# Patient Record
Sex: Female | Born: 1970 | Race: Black or African American | Hispanic: No | Marital: Single | Smoking: Never smoker
Health system: Southern US, Community
[De-identification: ages and names within clinical notes are randomized; demographics above are authoritative.]

## PROBLEM LIST (undated history)

## (undated) DIAGNOSIS — I10 Essential (primary) hypertension: Secondary | ICD-10-CM

## (undated) HISTORY — DX: Essential (primary) hypertension: I10

---

## 2007-05-27 ENCOUNTER — Other Ambulatory Visit: Admission: RE | Admit: 2007-05-27 | Discharge: 2007-05-27 | Payer: Self-pay | Admitting: Obstetrics and Gynecology

## 2009-07-21 ENCOUNTER — Other Ambulatory Visit: Admission: RE | Admit: 2009-07-21 | Discharge: 2009-07-21 | Payer: Self-pay | Admitting: Obstetrics and Gynecology

## 2010-09-19 ENCOUNTER — Other Ambulatory Visit: Admission: RE | Admit: 2010-09-19 | Discharge: 2010-09-19 | Payer: Self-pay | Admitting: Obstetrics and Gynecology

## 2014-01-23 ENCOUNTER — Other Ambulatory Visit: Payer: Self-pay

## 2014-01-23 DIAGNOSIS — Z1231 Encounter for screening mammogram for malignant neoplasm of breast: Secondary | ICD-10-CM

## 2014-02-10 ENCOUNTER — Other Ambulatory Visit: Payer: Self-pay | Admitting: Obstetrics and Gynecology

## 2014-02-10 ENCOUNTER — Other Ambulatory Visit (HOSPITAL_COMMUNITY)
Admission: RE | Admit: 2014-02-10 | Discharge: 2014-02-10 | Disposition: A | Discharge status: Home / Self Care | Payer: 59 | Source: Ambulatory Visit | Attending: Obstetrics and Gynecology | Admitting: Obstetrics and Gynecology

## 2014-02-10 DIAGNOSIS — Z01419 Encounter for gynecological examination (general) (routine) without abnormal findings: Secondary | ICD-10-CM | POA: Insufficient documentation

## 2014-02-10 DIAGNOSIS — Z1151 Encounter for screening for human papillomavirus (HPV): Secondary | ICD-10-CM | POA: Insufficient documentation

## 2014-02-11 ENCOUNTER — Ambulatory Visit: Payer: Self-pay

## 2014-02-13 ENCOUNTER — Ambulatory Visit
Admission: RE | Admit: 2014-02-13 | Discharge: 2014-02-13 | Disposition: A | Discharge status: Home / Self Care | Payer: 59 | Source: Ambulatory Visit

## 2014-02-13 DIAGNOSIS — Z1231 Encounter for screening mammogram for malignant neoplasm of breast: Secondary | ICD-10-CM

## 2015-01-28 ENCOUNTER — Other Ambulatory Visit: Payer: Self-pay | Admitting: Obstetrics and Gynecology

## 2015-01-28 ENCOUNTER — Other Ambulatory Visit (HOSPITAL_COMMUNITY)
Admission: RE | Admit: 2015-01-28 | Discharge: 2015-01-28 | Disposition: A | Discharge status: Home / Self Care | Payer: Self-pay | Source: Ambulatory Visit | Attending: Obstetrics and Gynecology | Admitting: Obstetrics and Gynecology

## 2015-01-28 DIAGNOSIS — Z01419 Encounter for gynecological examination (general) (routine) without abnormal findings: Secondary | ICD-10-CM | POA: Insufficient documentation

## 2015-02-02 LAB — CYTOLOGY - PAP

## 2015-05-05 ENCOUNTER — Other Ambulatory Visit: Payer: Self-pay

## 2015-05-05 DIAGNOSIS — Z1231 Encounter for screening mammogram for malignant neoplasm of breast: Secondary | ICD-10-CM

## 2015-05-07 ENCOUNTER — Ambulatory Visit
Admission: RE | Admit: 2015-05-07 | Discharge: 2015-05-07 | Disposition: A | Discharge status: Home / Self Care | Payer: 59 | Source: Ambulatory Visit

## 2015-05-07 DIAGNOSIS — Z1231 Encounter for screening mammogram for malignant neoplasm of breast: Secondary | ICD-10-CM

## 2016-11-01 ENCOUNTER — Other Ambulatory Visit: Payer: Self-pay | Admitting: Obstetrics and Gynecology

## 2016-11-01 DIAGNOSIS — Z1231 Encounter for screening mammogram for malignant neoplasm of breast: Secondary | ICD-10-CM

## 2016-11-27 ENCOUNTER — Ambulatory Visit: Payer: 59

## 2016-12-28 ENCOUNTER — Ambulatory Visit
Admission: RE | Admit: 2016-12-28 | Discharge: 2016-12-28 | Disposition: A | Discharge status: Home / Self Care | Payer: 59 | Source: Ambulatory Visit | Attending: Obstetrics and Gynecology | Admitting: Obstetrics and Gynecology

## 2016-12-28 DIAGNOSIS — Z1231 Encounter for screening mammogram for malignant neoplasm of breast: Secondary | ICD-10-CM | POA: Diagnosis not present

## 2017-07-10 ENCOUNTER — Other Ambulatory Visit (HOSPITAL_COMMUNITY)
Admission: RE | Admit: 2017-07-10 | Discharge: 2017-07-10 | Disposition: A | Discharge status: Home / Self Care | Payer: 59 | Source: Ambulatory Visit | Attending: Obstetrics and Gynecology | Admitting: Obstetrics and Gynecology

## 2017-07-10 ENCOUNTER — Other Ambulatory Visit: Payer: Self-pay | Admitting: Obstetrics and Gynecology

## 2017-07-10 DIAGNOSIS — Z01411 Encounter for gynecological examination (general) (routine) with abnormal findings: Secondary | ICD-10-CM | POA: Diagnosis not present

## 2017-07-10 DIAGNOSIS — Z124 Encounter for screening for malignant neoplasm of cervix: Secondary | ICD-10-CM | POA: Diagnosis not present

## 2017-07-12 LAB — CYTOLOGY - PAP
Diagnosis: NEGATIVE
HPV (WINDOPATH): NOT DETECTED

## 2018-07-31 DIAGNOSIS — R509 Fever, unspecified: Secondary | ICD-10-CM | POA: Diagnosis not present

## 2018-07-31 DIAGNOSIS — J209 Acute bronchitis, unspecified: Secondary | ICD-10-CM | POA: Diagnosis not present

## 2018-07-31 DIAGNOSIS — J029 Acute pharyngitis, unspecified: Secondary | ICD-10-CM | POA: Diagnosis not present

## 2018-08-19 DIAGNOSIS — D219 Benign neoplasm of connective and other soft tissue, unspecified: Secondary | ICD-10-CM | POA: Diagnosis not present

## 2018-08-19 DIAGNOSIS — Z01411 Encounter for gynecological examination (general) (routine) with abnormal findings: Secondary | ICD-10-CM | POA: Diagnosis not present

## 2018-08-28 DIAGNOSIS — R293 Abnormal posture: Secondary | ICD-10-CM | POA: Diagnosis not present

## 2018-08-28 DIAGNOSIS — M2560 Stiffness of unspecified joint, not elsewhere classified: Secondary | ICD-10-CM | POA: Diagnosis not present

## 2018-08-28 DIAGNOSIS — M545 Low back pain: Secondary | ICD-10-CM | POA: Diagnosis not present

## 2018-09-02 DIAGNOSIS — D219 Benign neoplasm of connective and other soft tissue, unspecified: Secondary | ICD-10-CM | POA: Diagnosis not present

## 2018-10-02 ENCOUNTER — Ambulatory Visit: Payer: 59

## 2018-10-02 ENCOUNTER — Other Ambulatory Visit: Payer: Self-pay | Admitting: Obstetrics and Gynecology

## 2018-10-02 DIAGNOSIS — Z1231 Encounter for screening mammogram for malignant neoplasm of breast: Secondary | ICD-10-CM

## 2018-10-04 ENCOUNTER — Ambulatory Visit
Admission: RE | Admit: 2018-10-04 | Discharge: 2018-10-04 | Disposition: A | Discharge status: Home / Self Care | Payer: 59 | Source: Ambulatory Visit | Attending: Obstetrics and Gynecology | Admitting: Obstetrics and Gynecology

## 2018-10-04 DIAGNOSIS — Z1231 Encounter for screening mammogram for malignant neoplasm of breast: Secondary | ICD-10-CM | POA: Diagnosis not present

## 2018-12-04 DIAGNOSIS — I10 Essential (primary) hypertension: Secondary | ICD-10-CM | POA: Diagnosis not present

## 2019-01-08 DIAGNOSIS — I10 Essential (primary) hypertension: Secondary | ICD-10-CM | POA: Diagnosis not present

## 2019-07-14 ENCOUNTER — Other Ambulatory Visit: Payer: Self-pay | Admitting: Physician Assistant

## 2019-11-09 IMAGING — MG DIGITAL SCREENING BILATERAL MAMMOGRAM WITH CAD
4 series · 4 of 4 positions shown · non-contrast
Comparison: Previous exam(s).

CLINICAL DATA: Screening.

EXAM:
DIGITAL SCREENING BILATERAL MAMMOGRAM WITH CAD

[L MLO]
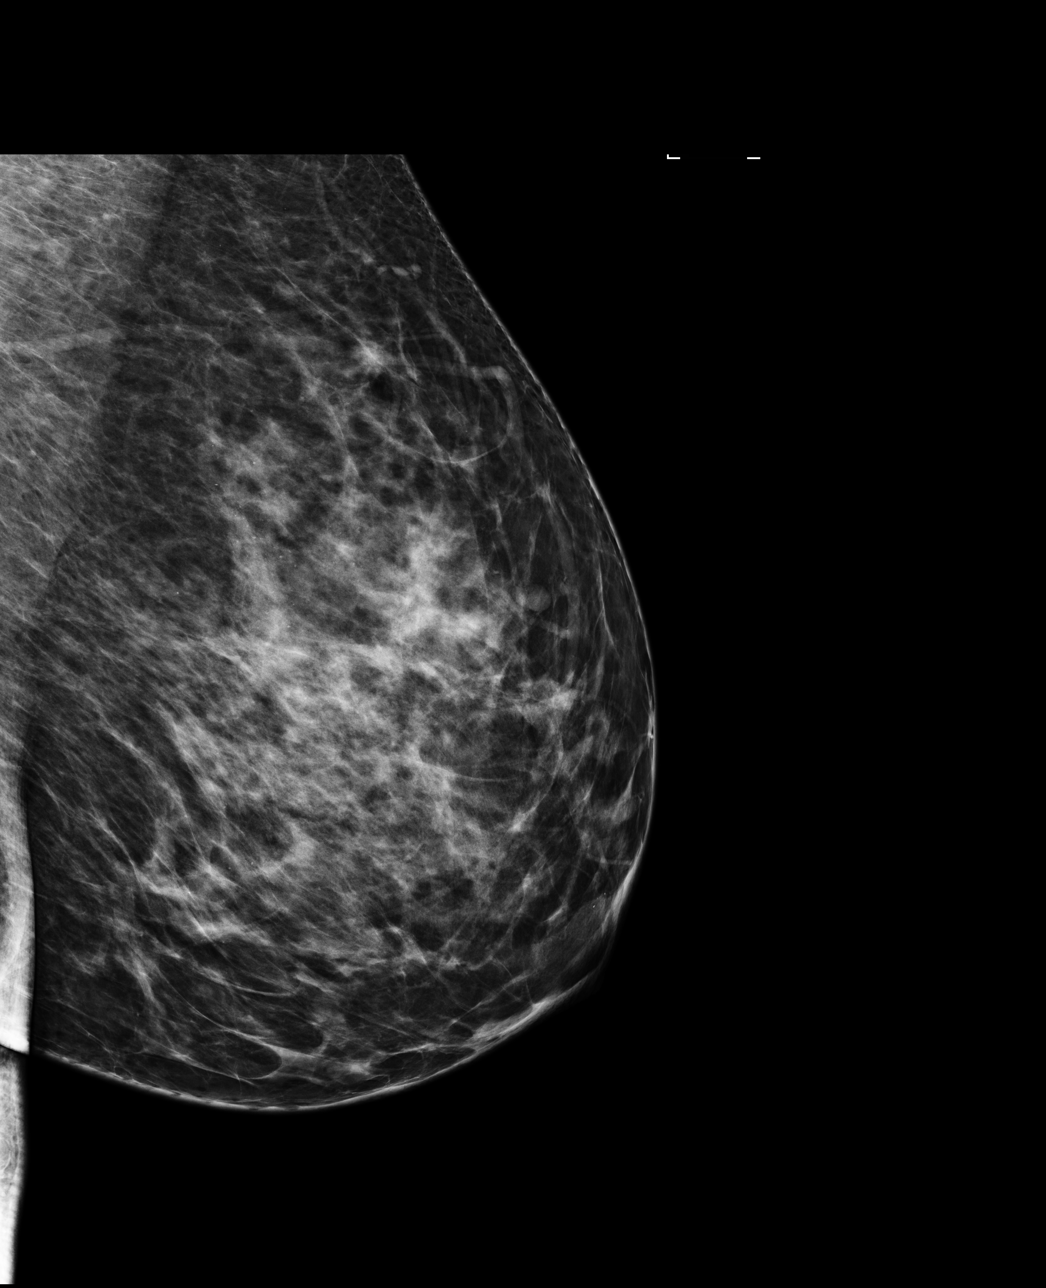

[R MLO]
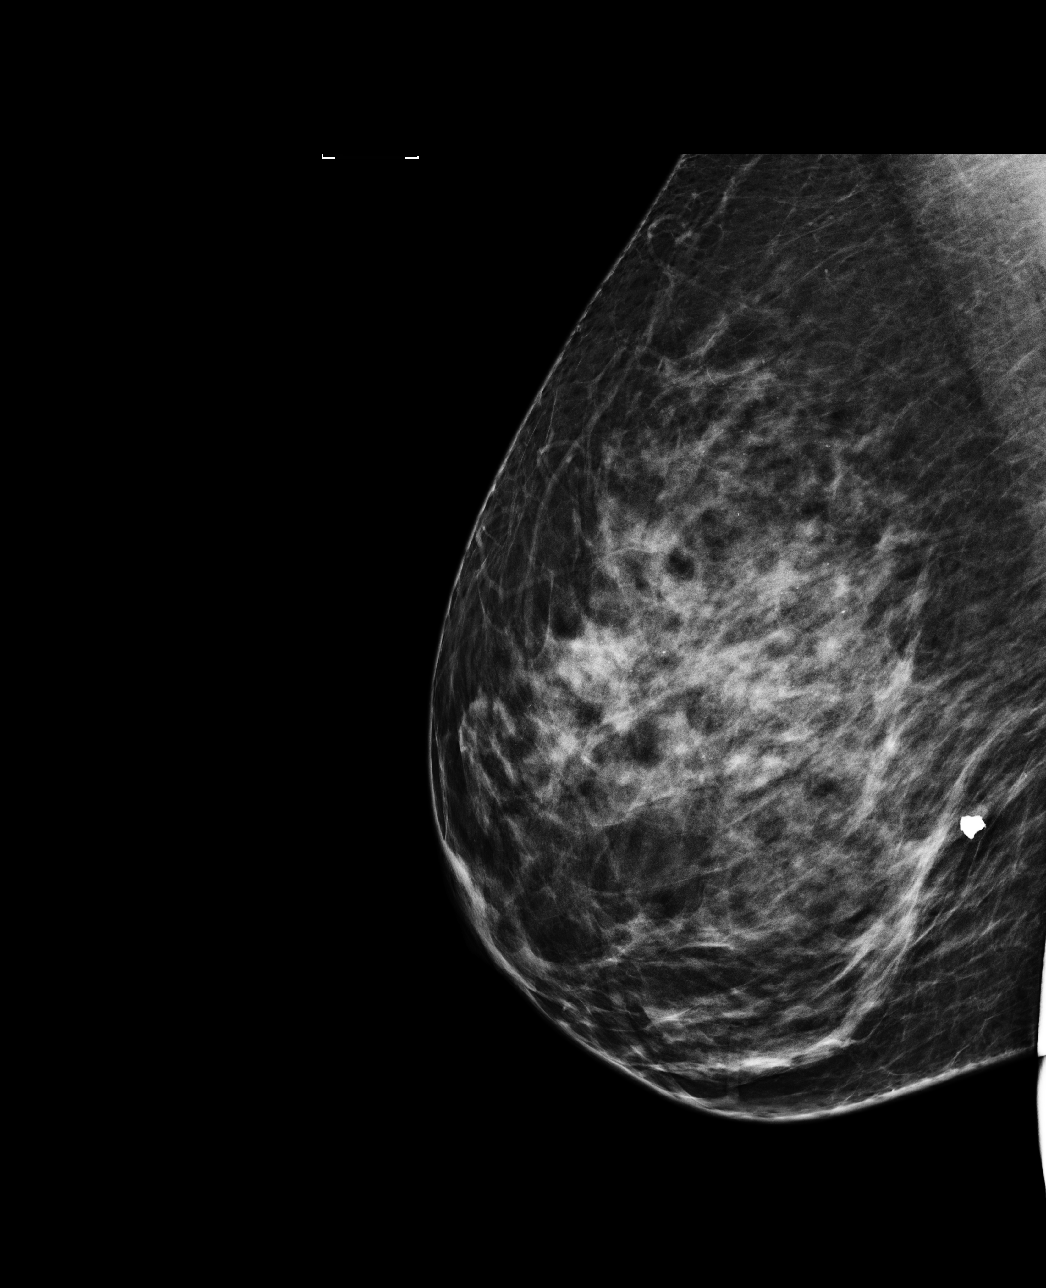

[R CC]
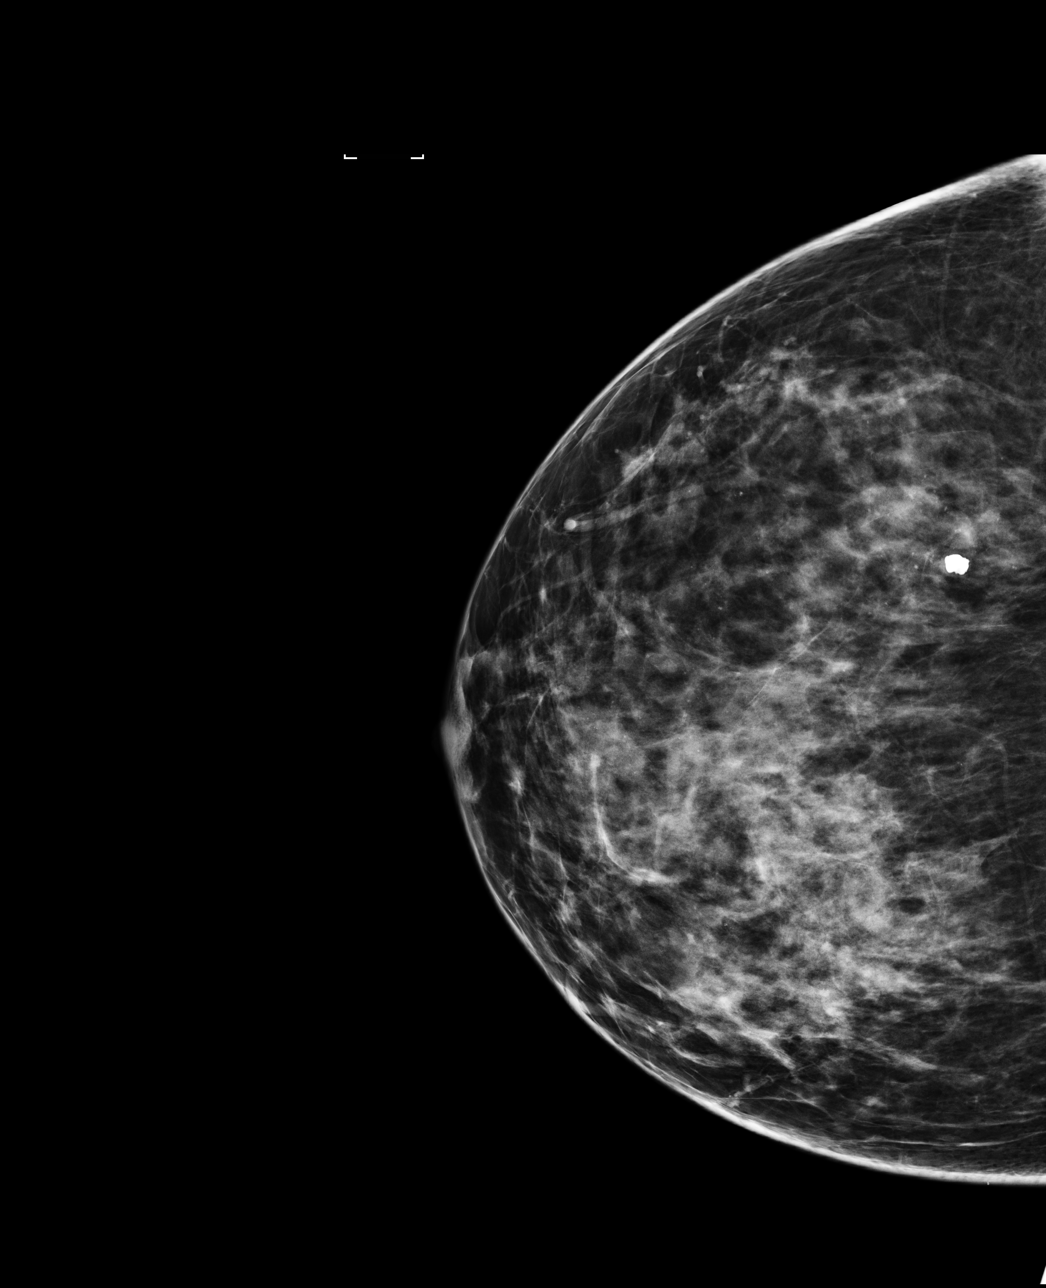

[L CC]
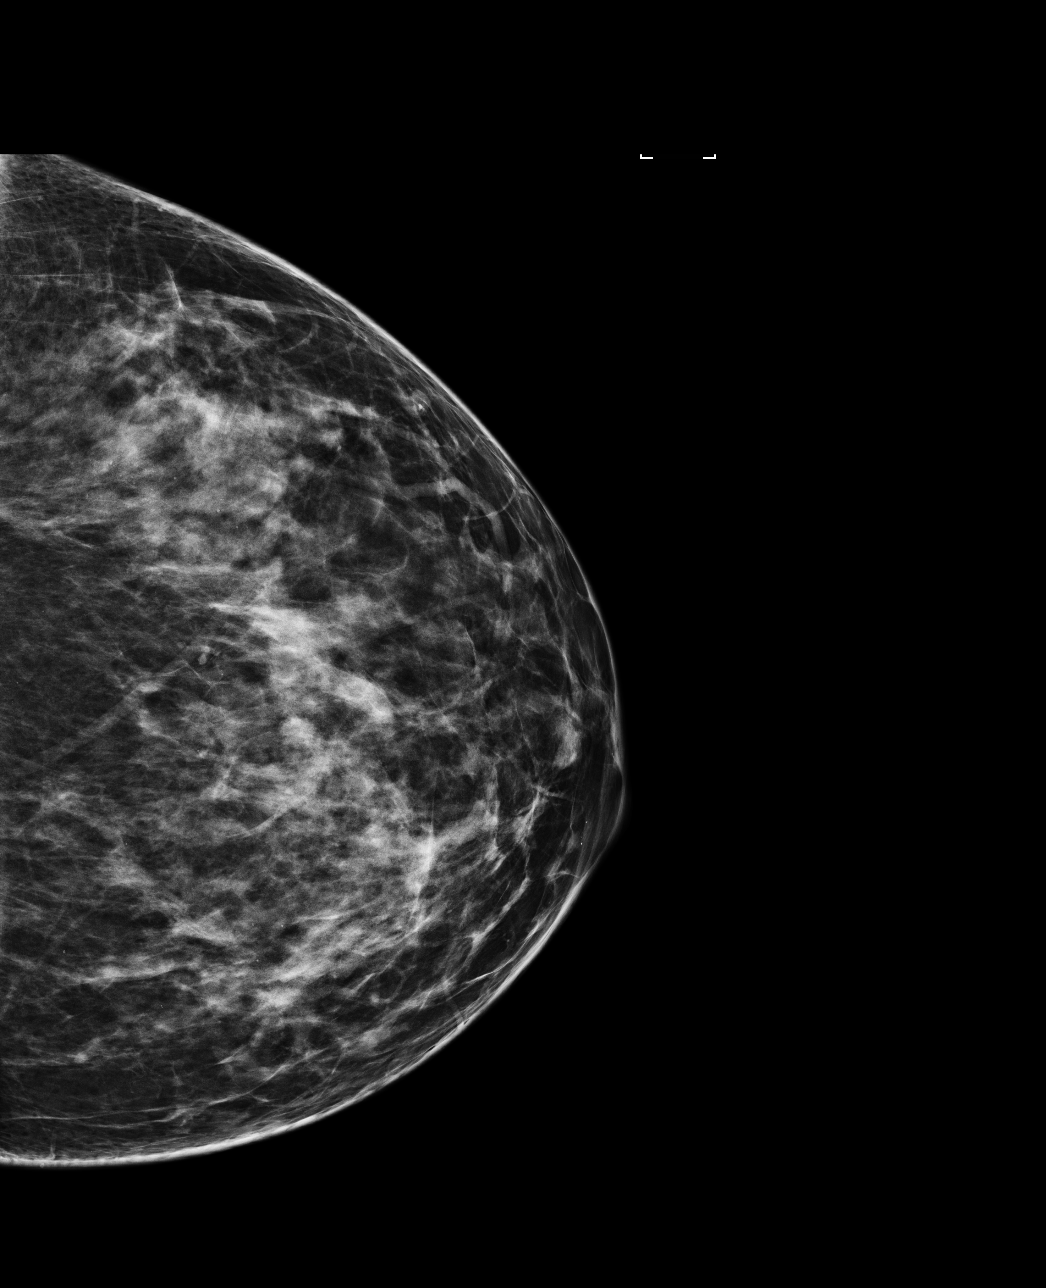

[4 of 4 positions shown; findings below may reference images not displayed]

ACR Breast Density Category c: The breast tissue is heterogeneously
dense, which may obscure small masses.
FINDINGS: There are no findings suspicious for malignancy. Images were
processed with CAD.
IMPRESSION: No mammographic evidence of malignancy. A result letter of this
screening mammogram will be mailed directly to the patient.

RECOMMENDATION:
Screening mammogram in one year. (Code:YJ-2-FEZ)

BI-RADS CATEGORY  1: Negative.

## 2019-11-26 ENCOUNTER — Other Ambulatory Visit: Payer: Self-pay | Admitting: Obstetrics and Gynecology

## 2019-11-26 DIAGNOSIS — Z1231 Encounter for screening mammogram for malignant neoplasm of breast: Secondary | ICD-10-CM

## 2020-01-15 ENCOUNTER — Ambulatory Visit
Admission: RE | Admit: 2020-01-15 | Discharge: 2020-01-15 | Disposition: A | Discharge status: Home / Self Care | Payer: 59 | Source: Ambulatory Visit | Attending: Obstetrics and Gynecology | Admitting: Obstetrics and Gynecology

## 2020-01-15 ENCOUNTER — Other Ambulatory Visit: Payer: Self-pay

## 2020-01-15 DIAGNOSIS — Z1231 Encounter for screening mammogram for malignant neoplasm of breast: Secondary | ICD-10-CM

## 2020-03-12 ENCOUNTER — Ambulatory Visit: Payer: 59 | Attending: Internal Medicine

## 2020-03-12 DIAGNOSIS — Z23 Encounter for immunization: Secondary | ICD-10-CM

## 2020-03-12 NOTE — Progress Notes (Signed)
   Covid-19 Vaccination Clinic  Name:  Kerri Cortez    MRN: [id number] DOB: 02-05-1971  03/12/2020  [name] was observed post Covid-19 immunization for 15 minutes without incident. She was provided with Vaccine Information Sheet and instruction to access the V-Safe system.   [name] was instructed to call 911 with any severe reactions post vaccine: Marland Kitchen Difficulty breathing  . Swelling of face and throat  . A fast heartbeat  . A bad rash all over body  . Dizziness and weakness   Immunizations Administered    Name Date Dose VIS Date Route   Pfizer COVID-19 Vaccine 03/12/2020  8:23 AM 0.3 mL 12/05/2019 Intramuscular   Manufacturer: [place]   Lot: [id number]   [place]: [id number]

## 2020-04-06 ENCOUNTER — Ambulatory Visit: Payer: 59 | Attending: Internal Medicine

## 2020-04-06 DIAGNOSIS — Z23 Encounter for immunization: Secondary | ICD-10-CM

## 2020-04-06 NOTE — Progress Notes (Signed)
   Covid-19 Vaccination Clinic  Name:  Kerri Cortez    MRN: [id number] DOB: 06-25-1971  04/06/2020  [name] was observed post Covid-19 immunization for 15 minutes without incident. She was provided with Vaccine Information Sheet and instruction to access the V-Safe system.   [name] was instructed to call 911 with any severe reactions post vaccine: Marland Kitchen Difficulty breathing  . Swelling of face and throat  . A fast heartbeat  . A bad rash all over body  . Dizziness and weakness   Immunizations Administered    Name Date Dose VIS Date Route   Pfizer COVID-19 Vaccine 04/06/2020  9:43 AM 0.3 mL 12/05/2019 Intramuscular   Manufacturer: [place]   Lot: [id number]   [place]: [id number]

## 2020-08-03 ENCOUNTER — Ambulatory Visit
Admission: RE | Admit: 2020-08-03 | Discharge: 2020-08-03 | Disposition: A | Discharge status: Home / Self Care | Payer: 59 | Source: Ambulatory Visit | Attending: Physician Assistant | Admitting: Physician Assistant

## 2020-08-03 ENCOUNTER — Other Ambulatory Visit: Payer: Self-pay | Admitting: Physician Assistant

## 2020-08-03 DIAGNOSIS — R0789 Other chest pain: Secondary | ICD-10-CM

## 2020-08-03 DIAGNOSIS — R0602 Shortness of breath: Secondary | ICD-10-CM

## 2020-08-22 DIAGNOSIS — R0789 Other chest pain: Secondary | ICD-10-CM | POA: Insufficient documentation

## 2020-08-22 NOTE — Progress Notes (Signed)
Patient referred by Lennie Odor, PA for exertional dyspnea   Subjective:   Kerri Cortez, female    DOB: 1971-12-23, 49 y.o.   MRN: [id number]   Chief Complaint  Patient presents with  . Chest Pain     HPI  49 y.o. African American female with hypertension, microcytic anemia, exertional dyspnea, leg edema  Patient reports exertional dyspnea with more than usual activity. She reports ankle swelling. She reports chest pain, only when she works long hours, until her shift ends. She denies any exertional chest pain symptoms.   Blood pressure is elevated today. She is currently on valsartan 80 mg, metoprolol succinate 100 mg daily.   She is anemic, known to have uterine fibroids. She sees OBGYN for this.   Past Medical History:  Diagnosis Date  . Hypertension      History reviewed. No pertinent surgical history.   Social History   Tobacco Use  Smoking Status Never Smoker  Smokeless Tobacco Never Used    Social History   Substance and Sexual Activity  Alcohol Use Not Currently     Family History  Problem Relation Age of Onset  . Hypertension Mother   . Heart disease Father      Current Outpatient Medications on File Prior to Visit  Medication Sig Dispense Refill  . metoprolol succinate (TOPROL-XL) 100 MG 24 hr tablet Take 100 mg by mouth daily.    Marland Kitchen POLY-IRON 150 150 MG capsule Take 150 mg by mouth 2 (two) times daily.    . valsartan (DIOVAN) 80 MG tablet Take 80 mg by mouth daily.     No current facility-administered medications on file prior to visit.    Cardiovascular and other pertinent studies:  EKG 08/23/2020: Sinus rhythm 78 bpm First degree A-V block  Borderline left atrial enlargement Nonspecific T-abnormality  Recent labs: 08/03/2020: Glucose 96, BUN/Cr 16/0.93. EGFR 78. Na/K 137/3.8. Rest of the CMP normal H/H 9.4/30. MCV 63.9. Platelets 368 Chol 171, TG 325, HDL 38, LDL 80   Review of Systems  Cardiovascular: Positive for  dyspnea on exertion and leg swelling. Negative for chest pain, palpitations and syncope.  Respiratory: Positive for shortness of breath.          Vitals:   08/23/20 0905  BP: (!) 152/102  Pulse: 82  Resp: 16  SpO2: 98%     Body mass index is 32.96 kg/m. Filed Weights   08/23/20 0905  Weight: 204 lb 3.2 oz (92.6 kg)     Objective:   Physical Exam Vitals and nursing note reviewed.  Constitutional:      General: She is not in acute distress. Neck:     Vascular: No JVD.  Cardiovascular:     Rate and Rhythm: Normal rate and regular rhythm.     Heart sounds: Normal heart sounds. No murmur heard.   Pulmonary:     Effort: Pulmonary effort is normal.     Breath sounds: Normal breath sounds. No wheezing or rales.  Musculoskeletal:     Right lower leg: Edema (Trace) present.     Left lower leg: Edema (Trace) present.         Assessment & Recommendations:   49 y.o. African American female with hypertension, microcytic anemia, exertional dyspnea, leg edema  Exertional dyspnea: I suspect her symptoms are primarily due to anemia and hypertension. I recommend continued management of anemia. I have switched valsartan 80 mg to chlorthalidone 25 mg daily. I will obtain echocardiogram. Unless any significant abnormalities  I will see her on as needed basis.  Hypertension: As above  Elevated triglyceride: Recommend low carbohydrate diet. Consider repeating fasting lipid panel in 3 months.   Thank you for referring the patient to us. Please feel free to contact with any questions.   Manish J Patwardhan, MD Pager: [phone] Office: [phone] 

## 2020-08-23 ENCOUNTER — Ambulatory Visit: Payer: 59 | Admitting: Cardiology

## 2020-08-23 ENCOUNTER — Other Ambulatory Visit: Payer: Self-pay

## 2020-08-23 ENCOUNTER — Encounter: Payer: Self-pay | Admitting: Cardiology

## 2020-08-23 VITALS — BP 146/89 | HR 86 | Resp 16 | Ht 66.0 in | Wt 204.2 lb

## 2020-08-23 DIAGNOSIS — D509 Iron deficiency anemia, unspecified: Secondary | ICD-10-CM

## 2020-08-23 DIAGNOSIS — R0600 Dyspnea, unspecified: Secondary | ICD-10-CM

## 2020-08-23 DIAGNOSIS — R0609 Other forms of dyspnea: Secondary | ICD-10-CM

## 2020-08-23 DIAGNOSIS — I10 Essential (primary) hypertension: Secondary | ICD-10-CM | POA: Insufficient documentation

## 2020-08-23 MED ORDER — CHLORTHALIDONE 25 MG PO TABS: 25.0000 mg | ORAL_TABLET | Freq: Every day | ORAL | 1 refills | Status: AC

## 2020-09-01 ENCOUNTER — Other Ambulatory Visit: Payer: Self-pay

## 2020-09-01 ENCOUNTER — Ambulatory Visit: Payer: 59

## 2020-09-01 DIAGNOSIS — I10 Essential (primary) hypertension: Secondary | ICD-10-CM

## 2021-02-19 IMAGING — MG DIGITAL SCREENING BILAT W/ CAD
4 series · 4 of 4 positions shown · non-contrast
Comparison: Previous exam(s).

CLINICAL DATA: Screening.

EXAM:
DIGITAL SCREENING BILATERAL MAMMOGRAM WITH CAD

[R MLO]
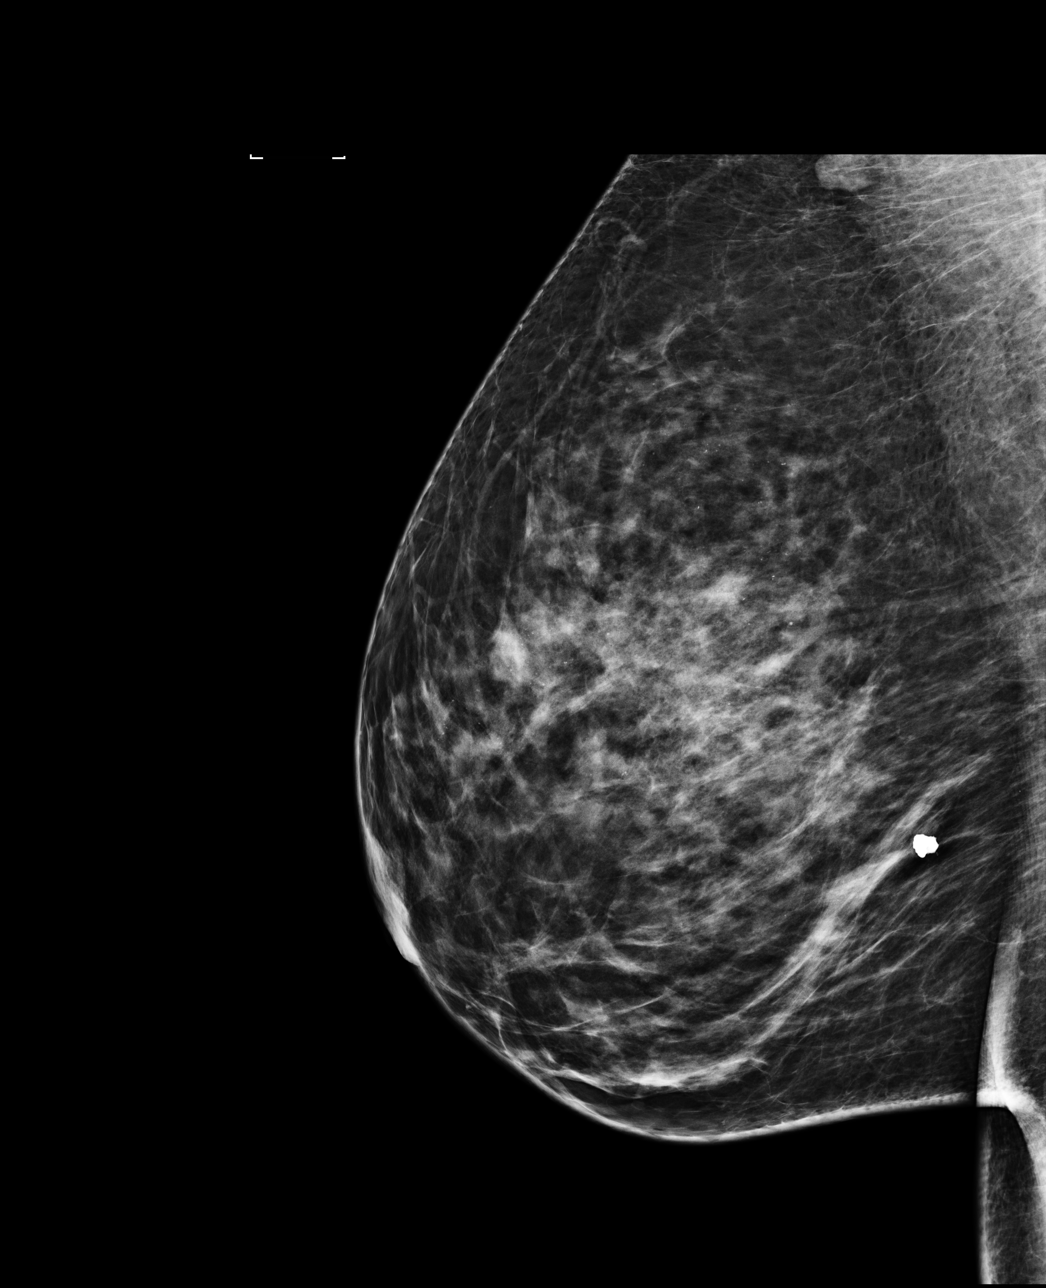

[L CC]
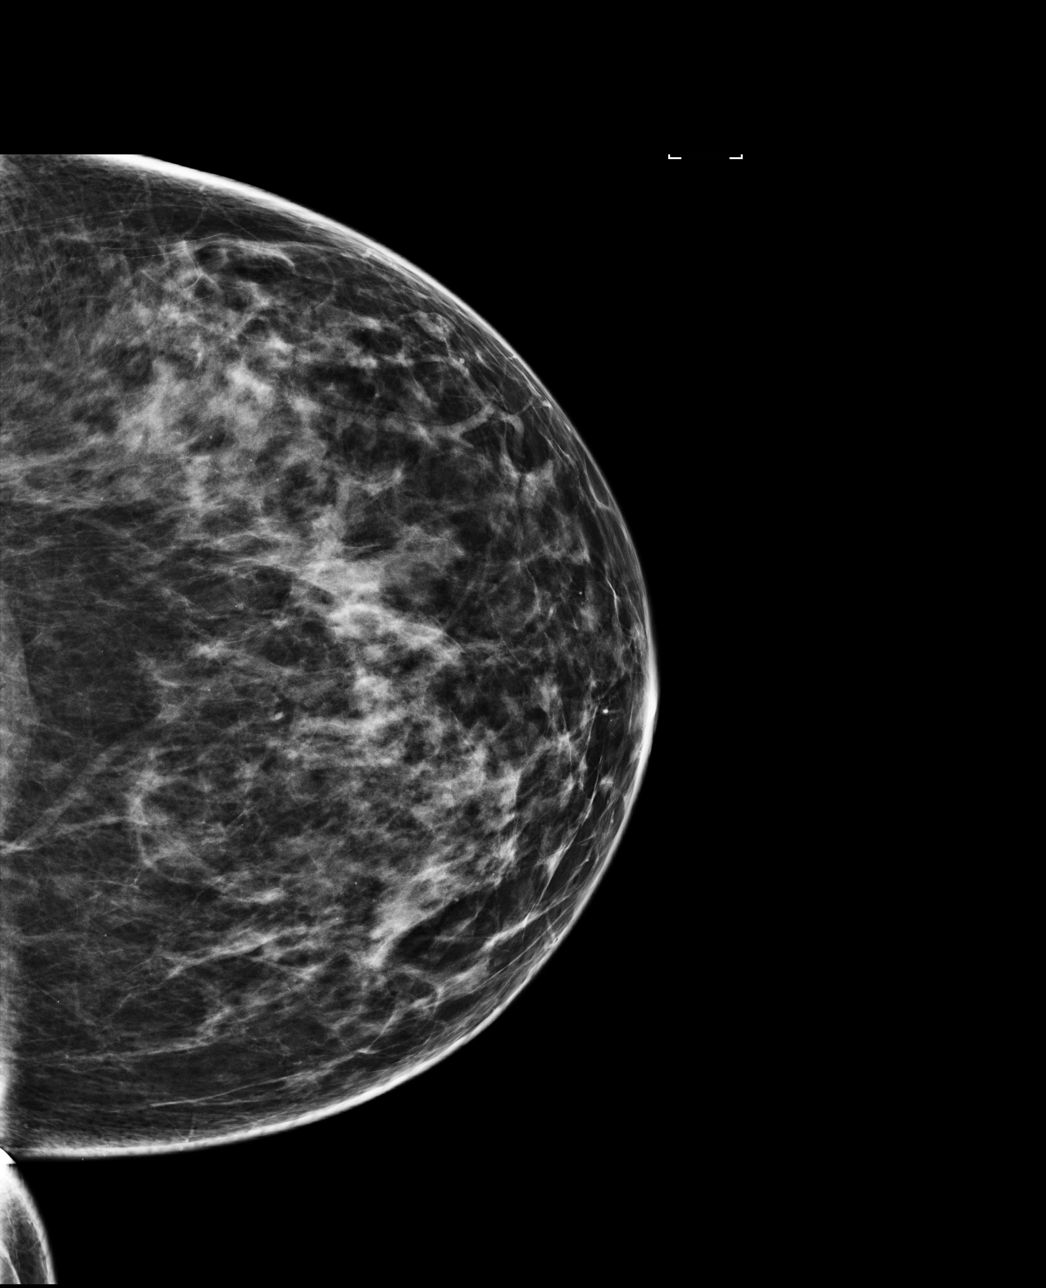

[R CC]
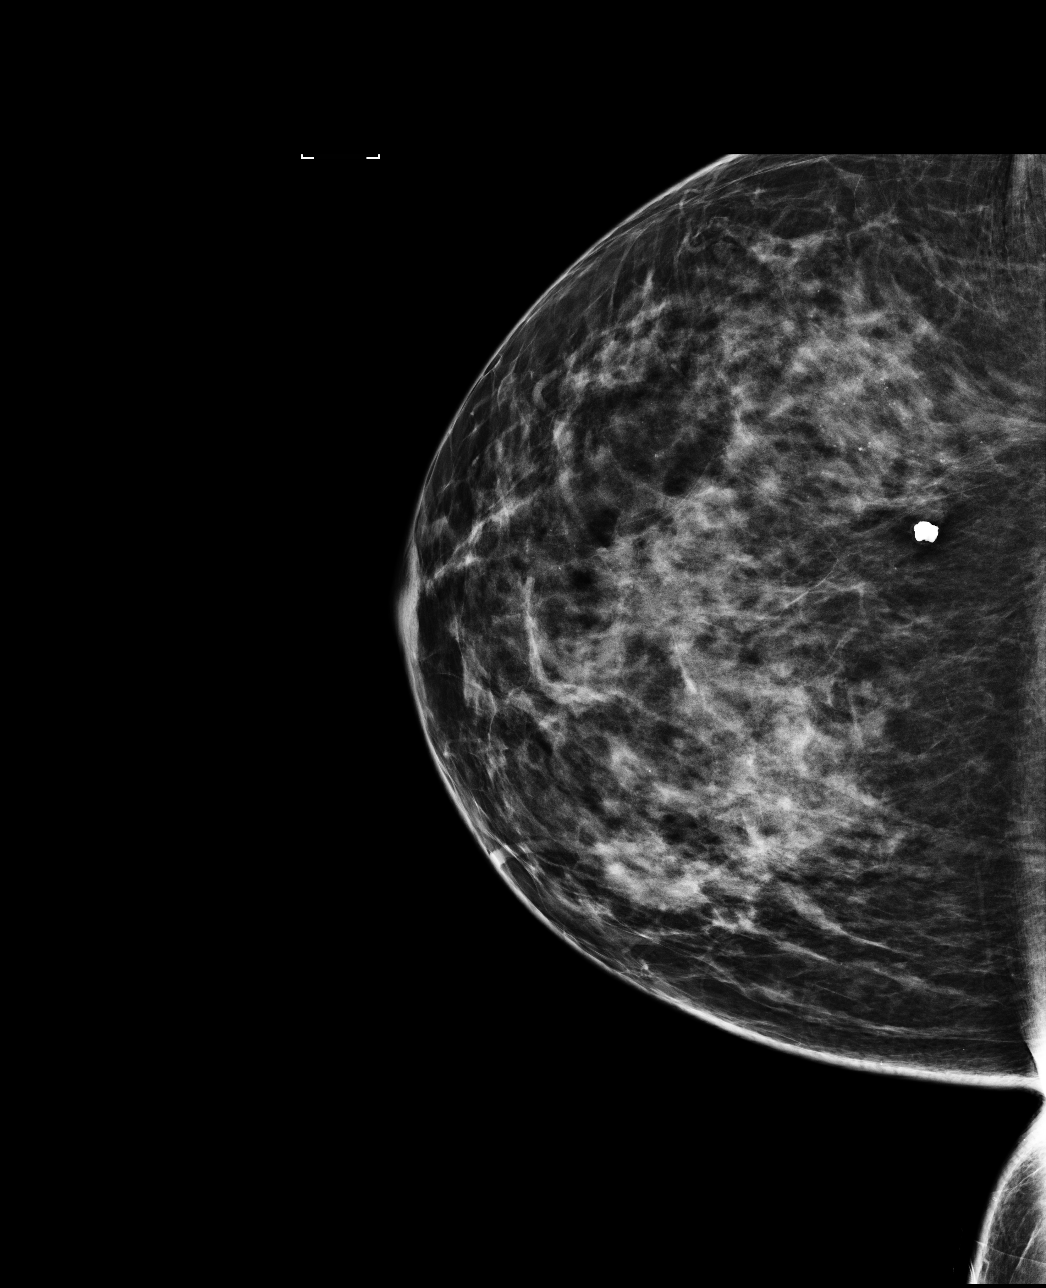

[L MLO]
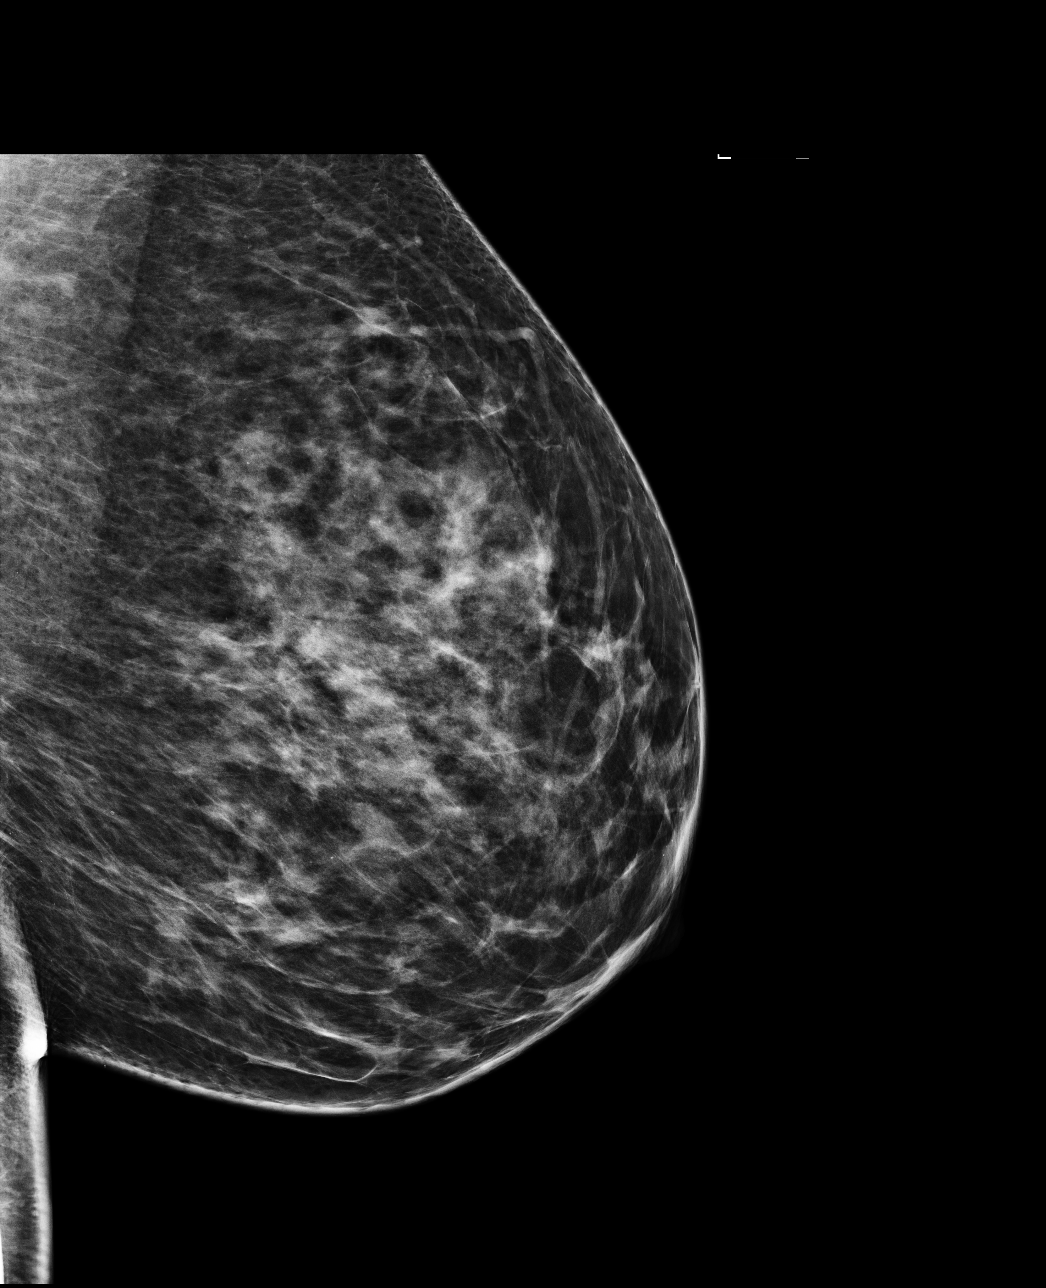

[4 of 4 positions shown; findings below may reference images not displayed]

ACR Breast Density Category c: The breast tissue is heterogeneously
dense, which may obscure small masses.
FINDINGS: There are no findings suspicious for malignancy. Images were
processed with CAD.
IMPRESSION: No mammographic evidence of malignancy. A result letter of this
screening mammogram will be mailed directly to the patient.

RECOMMENDATION:
Screening mammogram in one year. (Code:YJ-[id number])

BI-RADS CATEGORY  1: Negative.

## 2021-08-23 ENCOUNTER — Other Ambulatory Visit: Payer: Self-pay | Admitting: Obstetrics and Gynecology

## 2021-08-23 DIAGNOSIS — Z1231 Encounter for screening mammogram for malignant neoplasm of breast: Secondary | ICD-10-CM

## 2021-08-24 ENCOUNTER — Other Ambulatory Visit: Payer: Self-pay

## 2021-08-24 ENCOUNTER — Ambulatory Visit
Admission: RE | Admit: 2021-08-24 | Discharge: 2021-08-24 | Disposition: A | Discharge status: Home / Self Care | Payer: 59 | Source: Ambulatory Visit | Attending: Obstetrics and Gynecology | Admitting: Obstetrics and Gynecology

## 2021-08-24 DIAGNOSIS — Z1231 Encounter for screening mammogram for malignant neoplasm of breast: Secondary | ICD-10-CM

## 2022-05-03 ENCOUNTER — Other Ambulatory Visit: Payer: Self-pay | Admitting: Obstetrics and Gynecology

## 2023-01-13 DIAGNOSIS — R69 Illness, unspecified: Secondary | ICD-10-CM | POA: Diagnosis not present

## 2023-01-24 DIAGNOSIS — R69 Illness, unspecified: Secondary | ICD-10-CM | POA: Diagnosis not present

## 2023-02-03 DIAGNOSIS — R69 Illness, unspecified: Secondary | ICD-10-CM | POA: Diagnosis not present

## 2023-02-08 DIAGNOSIS — F411 Generalized anxiety disorder: Secondary | ICD-10-CM | POA: Diagnosis not present

## 2023-02-17 DIAGNOSIS — F411 Generalized anxiety disorder: Secondary | ICD-10-CM | POA: Diagnosis not present

## 2023-02-21 DIAGNOSIS — F411 Generalized anxiety disorder: Secondary | ICD-10-CM | POA: Diagnosis not present

## 2023-03-08 DIAGNOSIS — F411 Generalized anxiety disorder: Secondary | ICD-10-CM | POA: Diagnosis not present

## 2023-03-17 DIAGNOSIS — F411 Generalized anxiety disorder: Secondary | ICD-10-CM | POA: Diagnosis not present

## 2023-03-21 DIAGNOSIS — F411 Generalized anxiety disorder: Secondary | ICD-10-CM | POA: Diagnosis not present

## 2023-03-28 DIAGNOSIS — F411 Generalized anxiety disorder: Secondary | ICD-10-CM | POA: Diagnosis not present

## 2023-04-02 ENCOUNTER — Emergency Department (HOSPITAL_COMMUNITY): Payer: No Typology Code available for payment source

## 2023-04-02 ENCOUNTER — Other Ambulatory Visit: Payer: Self-pay

## 2023-04-02 ENCOUNTER — Emergency Department (HOSPITAL_COMMUNITY)
Admission: EM | Admit: 2023-04-02 | Discharge: 2023-04-02 | Disposition: A | Discharge status: Home / Self Care | Payer: No Typology Code available for payment source | Attending: Emergency Medicine | Admitting: Emergency Medicine

## 2023-04-02 DIAGNOSIS — M546 Pain in thoracic spine: Secondary | ICD-10-CM | POA: Diagnosis not present

## 2023-04-02 DIAGNOSIS — Y92410 Unspecified street and highway as the place of occurrence of the external cause: Secondary | ICD-10-CM | POA: Diagnosis not present

## 2023-04-02 DIAGNOSIS — M79644 Pain in right finger(s): Secondary | ICD-10-CM | POA: Insufficient documentation

## 2023-04-02 DIAGNOSIS — M25511 Pain in right shoulder: Secondary | ICD-10-CM | POA: Insufficient documentation

## 2023-04-02 DIAGNOSIS — M542 Cervicalgia: Secondary | ICD-10-CM | POA: Insufficient documentation

## 2023-04-02 MED ORDER — NAPROXEN 500 MG PO TABS: 500.0000 mg | ORAL_TABLET | Freq: Two times a day (BID) | ORAL | 0 refills | Status: AC

## 2023-04-02 MED ORDER — ACETAMINOPHEN 500 MG PO TABS: 500.0000 mg | ORAL_TABLET | Freq: Four times a day (QID) | ORAL | 0 refills | Status: AC | PRN

## 2023-04-02 MED ORDER — METHOCARBAMOL 500 MG PO TABS
750.0000 mg | ORAL_TABLET | Freq: Once | ORAL | Status: DC
  Filled 2023-04-02: qty 2

## 2023-04-02 MED ORDER — LIDOCAINE 5 % EX PTCH
2.0000 | MEDICATED_PATCH | CUTANEOUS | Status: DC
  Administered 2023-04-02: 2 via TRANSDERMAL
  Filled 2023-04-02: qty 2

## 2023-04-02 MED ORDER — LIDOCAINE 5 % EX PTCH: 1.0000 | MEDICATED_PATCH | CUTANEOUS | 0 refills | Status: AC

## 2023-04-02 MED ORDER — METHOCARBAMOL 500 MG PO TABS: 500.0000 mg | ORAL_TABLET | Freq: Two times a day (BID) | ORAL | 0 refills | Status: AC

## 2023-04-02 MED ORDER — NAPROXEN 500 MG PO TABS
500.0000 mg | ORAL_TABLET | Freq: Once | ORAL | Status: AC
  Administered 2023-04-02: 500 mg via ORAL
  Filled 2023-04-02: qty 1
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
## 2023-04-07 DIAGNOSIS — F411 Generalized anxiety disorder: Secondary | ICD-10-CM | POA: Diagnosis not present

## 2023-04-11 DIAGNOSIS — F411 Generalized anxiety disorder: Secondary | ICD-10-CM | POA: Diagnosis not present

## 2023-04-16 DIAGNOSIS — D259 Leiomyoma of uterus, unspecified: Secondary | ICD-10-CM | POA: Diagnosis not present

## 2023-04-16 DIAGNOSIS — Z01419 Encounter for gynecological examination (general) (routine) without abnormal findings: Secondary | ICD-10-CM | POA: Diagnosis not present

## 2023-04-18 DIAGNOSIS — F411 Generalized anxiety disorder: Secondary | ICD-10-CM | POA: Diagnosis not present

## 2023-05-02 DIAGNOSIS — F411 Generalized anxiety disorder: Secondary | ICD-10-CM | POA: Diagnosis not present

## 2023-05-08 DIAGNOSIS — D259 Leiomyoma of uterus, unspecified: Secondary | ICD-10-CM | POA: Diagnosis not present

## 2023-05-09 DIAGNOSIS — F411 Generalized anxiety disorder: Secondary | ICD-10-CM | POA: Diagnosis not present

## 2023-05-23 DIAGNOSIS — F411 Generalized anxiety disorder: Secondary | ICD-10-CM | POA: Diagnosis not present

## 2023-05-24 DIAGNOSIS — E876 Hypokalemia: Secondary | ICD-10-CM | POA: Diagnosis not present

## 2023-06-02 DIAGNOSIS — F411 Generalized anxiety disorder: Secondary | ICD-10-CM | POA: Diagnosis not present

## 2023-06-09 DIAGNOSIS — F411 Generalized anxiety disorder: Secondary | ICD-10-CM | POA: Diagnosis not present

## 2023-06-20 DIAGNOSIS — F411 Generalized anxiety disorder: Secondary | ICD-10-CM | POA: Diagnosis not present

## 2023-06-26 DIAGNOSIS — N920 Excessive and frequent menstruation with regular cycle: Secondary | ICD-10-CM | POA: Diagnosis not present

## 2023-06-26 DIAGNOSIS — D251 Intramural leiomyoma of uterus: Secondary | ICD-10-CM | POA: Diagnosis not present

## 2023-06-27 DIAGNOSIS — F411 Generalized anxiety disorder: Secondary | ICD-10-CM | POA: Diagnosis not present

## 2023-07-07 DIAGNOSIS — F411 Generalized anxiety disorder: Secondary | ICD-10-CM | POA: Diagnosis not present

## 2023-07-11 DIAGNOSIS — R002 Palpitations: Secondary | ICD-10-CM | POA: Diagnosis not present

## 2023-07-11 DIAGNOSIS — M545 Low back pain, unspecified: Secondary | ICD-10-CM | POA: Diagnosis not present

## 2023-07-11 DIAGNOSIS — I10 Essential (primary) hypertension: Secondary | ICD-10-CM | POA: Diagnosis not present

## 2023-07-11 DIAGNOSIS — Z79899 Other long term (current) drug therapy: Secondary | ICD-10-CM | POA: Diagnosis not present

## 2023-07-12 ENCOUNTER — Other Ambulatory Visit: Payer: Self-pay | Admitting: Physician Assistant

## 2023-07-12 ENCOUNTER — Ambulatory Visit
Admission: RE | Admit: 2023-07-12 | Discharge: 2023-07-12 | Disposition: A | Discharge status: Home / Self Care | Payer: 59 | Source: Ambulatory Visit | Attending: Physician Assistant | Admitting: Physician Assistant

## 2023-07-12 DIAGNOSIS — M545 Low back pain, unspecified: Secondary | ICD-10-CM

## 2023-07-12 DIAGNOSIS — M5136 Other intervertebral disc degeneration, lumbar region: Secondary | ICD-10-CM | POA: Diagnosis not present

## 2023-07-18 DIAGNOSIS — F411 Generalized anxiety disorder: Secondary | ICD-10-CM | POA: Diagnosis not present

## 2023-07-23 ENCOUNTER — Other Ambulatory Visit: Payer: Self-pay | Admitting: Interventional Radiology

## 2023-07-23 ENCOUNTER — Other Ambulatory Visit: Payer: Self-pay | Admitting: Obstetrics and Gynecology

## 2023-07-23 DIAGNOSIS — D259 Leiomyoma of uterus, unspecified: Secondary | ICD-10-CM

## 2023-07-24 DIAGNOSIS — M5451 Vertebrogenic low back pain: Secondary | ICD-10-CM | POA: Diagnosis not present

## 2023-07-25 DIAGNOSIS — F411 Generalized anxiety disorder: Secondary | ICD-10-CM | POA: Diagnosis not present

## 2023-08-03 DIAGNOSIS — M5451 Vertebrogenic low back pain: Secondary | ICD-10-CM | POA: Diagnosis not present

## 2023-08-06 ENCOUNTER — Other Ambulatory Visit: Payer: 59

## 2023-08-06 DIAGNOSIS — F411 Generalized anxiety disorder: Secondary | ICD-10-CM | POA: Diagnosis not present

## 2023-08-07 ENCOUNTER — Encounter: Payer: Self-pay | Admitting: Interventional Radiology

## 2023-08-08 DIAGNOSIS — M5451 Vertebrogenic low back pain: Secondary | ICD-10-CM | POA: Diagnosis not present

## 2023-08-09 ENCOUNTER — Other Ambulatory Visit: Payer: 59

## 2023-08-10 DIAGNOSIS — M5451 Vertebrogenic low back pain: Secondary | ICD-10-CM | POA: Diagnosis not present

## 2023-08-14 DIAGNOSIS — M5451 Vertebrogenic low back pain: Secondary | ICD-10-CM | POA: Diagnosis not present

## 2023-08-17 ENCOUNTER — Ambulatory Visit
Admission: RE | Admit: 2023-08-17 | Discharge: 2023-08-17 | Disposition: A | Discharge status: Home / Self Care | Payer: 59 | Source: Ambulatory Visit | Attending: Interventional Radiology | Admitting: Interventional Radiology

## 2023-08-17 DIAGNOSIS — D259 Leiomyoma of uterus, unspecified: Secondary | ICD-10-CM | POA: Diagnosis not present

## 2023-08-17 MED ORDER — GADOBUTROL 1 MMOL/ML IV SOLN
9.0000 mL | Freq: Once | INTRAVENOUS | Status: AC | PRN
  Administered 2023-08-17: 9 mL via INTRAVENOUS

## 2023-08-18 DIAGNOSIS — F411 Generalized anxiety disorder: Secondary | ICD-10-CM | POA: Diagnosis not present

## 2023-08-20 DIAGNOSIS — M5451 Vertebrogenic low back pain: Secondary | ICD-10-CM | POA: Diagnosis not present

## 2023-08-21 ENCOUNTER — Ambulatory Visit
Admission: RE | Admit: 2023-08-21 | Discharge: 2023-08-21 | Disposition: A | Discharge status: Home / Self Care | Payer: 59 | Source: Ambulatory Visit | Attending: Obstetrics and Gynecology | Admitting: Obstetrics and Gynecology

## 2023-08-21 DIAGNOSIS — D259 Leiomyoma of uterus, unspecified: Secondary | ICD-10-CM

## 2023-08-21 HISTORY — PX: IR RADIOLOGIST EVAL & MGMT: IMG5224

## 2023-08-21 NOTE — Consult Note (Signed)
Chief Complaint: Patient was seen in consultation today for uterine fibroids at the request of Cole,Tara  Referring Physician(s): Cole,Tara  History of Present Illness: Kerri Cortez is a 52 y.o. female With a history of uterine fibroids.  Her cycles occur regularly every 28 days and last for approximately 5 days.  Heavy bleeding days are the first 4 days requiring use of both superabsorbent tampons and pads.  She has to change these every 2 hours which is disruptive.  Her most recent negative Pap smear was on 04/05/2021.  Endometrial biopsy was performed on 05/06/2022 and was also negative.  Fibroids were diagnosed on 05/03/2022.  Subsequent MRI was performed on 08/17/2023.  Her symptoms are primarily bulk related.  Whenever she bends over she can feel the fibroids in her abdomen making it uncomfortable.  She is currently on Myfembree but is having trouble with insurance coverage for the medication.  She is interested in learning more about possible procedures for treatment.  Past Medical History:  Diagnosis Date   Hypertension     Past Surgical History:  Procedure Laterality Date   IR RADIOLOGIST EVAL & MGMT  08/21/2023    Allergies: Patient has no known allergies.  Medications: Prior to Admission medications   Medication Sig Start Date End Date Taking? Authorizing Provider  acetaminophen (TYLENOL) 500 MG tablet Take 1 tablet (500 mg total) by mouth every 6 (six) hours as needed. 04/02/23  Yes Redwine, Madison A, PA-C  lidocaine (LIDODERM) 5 % Place 1 patch onto the skin daily. Remove & Discard patch within 12 hours or as directed by MD 04/02/23  Yes Redwine, Madison A, PA-C  methocarbamol (ROBAXIN) 500 MG tablet Take 1 tablet (500 mg total) by mouth 2 (two) times daily. 04/02/23  Yes Redwine, Madison A, PA-C  metoprolol succinate (TOPROL-XL) 100 MG 24 hr tablet Take 100 mg by mouth daily. 08/18/20  Yes [provider]  naproxen (NAPROSYN) 500 MG tablet Take 1 tablet (500  mg total) by mouth 2 (two) times daily. 04/02/23  Yes Redwine, Madison A, PA-C  POLY-IRON 150 150 MG capsule Take 150 mg by mouth 2 (two) times daily. 08/06/20  Yes [provider]  chlorthalidone (HYGROTON) 25 MG tablet Take 1 tablet (25 mg total) by mouth daily. 08/23/20 11/21/20  Patwardhan, Anabel Bene, MD     Family History  Problem Relation Age of Onset   Hypertension Mother    Heart disease Father     Social History   Socioeconomic History   Marital status: Single    Spouse name: Not on file   Number of children: 1   Years of education: Not on file   Highest education level: Not on file  Occupational History   Not on file  Tobacco Use   Smoking status: Never   Smokeless tobacco: Never  Vaping Use   Vaping status: Never Used  Substance and Sexual Activity   Alcohol use: Not Currently   Drug use: Never   Sexual activity: Not on file  Other Topics Concern   Not on file  Social History Narrative   Not on file   Social Determinants of Health   Financial Resource Strain: Not on file  Food Insecurity: Not on file  Transportation Needs: Not on file  Physical Activity: Not on file  Stress: Not on file  Social Connections: Not on file    Review of Systems: A 12 point ROS discussed and pertinent positives are indicated in the HPI above.  All  other systems are negative.  Review of Systems  Vital Signs: BP 122/68 (BP Location: Right Arm, Patient Position: Sitting, Cuff Size: Normal)   Pulse 71   Temp 97.7 F (36.5 C) (Oral)   Resp 14   Ht 5\' 4"  (1.626 m)   Wt 93.4 kg   SpO2 97%   BMI 35.36 kg/m     Physical Exam Constitutional:      General: She is not in acute distress.    Appearance: Normal appearance.  HENT:     Head: Normocephalic and atraumatic.  Eyes:     General: No scleral icterus. Cardiovascular:     Rate and Rhythm: Normal rate.  Pulmonary:     Effort: Pulmonary effort is normal.  Abdominal:     Palpations: There is mass.      Tenderness: There is no abdominal tenderness. There is no guarding.  Skin:    General: Skin is warm and dry.  Neurological:     Mental Status: She is alert and oriented to person, place, and time.  Psychiatric:        Mood and Affect: Mood normal.        Behavior: Behavior normal.       Imaging: IR Radiologist Eval & Mgmt  Result Date: 08/21/2023 EXAM: NEW PATIENT OFFICE VISIT CHIEF COMPLAINT: SEE EPIC NOTE HISTORY OF PRESENT ILLNESS: SEE EPIC NOTE REVIEW OF SYSTEMS: SEE EPIC NOTE PHYSICAL EXAMINATION: SEE EPIC NOTE ASSESSMENT AND PLAN: SEE EPIC NOTE Electronically Signed   By: Malachy Moan M.D.   On: 08/21/2023 15:22    Labs:  CBC: No results for input(s): "WBC", "HGB", "HCT", "PLT" in the last 8760 hours.  COAGS: No results for input(s): "INR", "APTT" in the last 8760 hours.  BMP: No results for input(s): "NA", "K", "CL", "CO2", "GLUCOSE", "BUN", "CALCIUM", "CREATININE", "GFRNONAA", "GFRAA" in the last 8760 hours.  Invalid input(s): "CMP"  LIVER FUNCTION TESTS: No results for input(s): "BILITOT", "AST", "ALT", "ALKPHOS", "PROT", "ALBUMIN" in the last 8760 hours.  TUMOR MARKERS: No results for input(s): "AFPTM", "CEA", "CA199", "CHROMGRNA" in the last 8760 hours.  Assessment and Plan:  Very pleasant 52 year old female with symptomatic uterine fibroids.  Her primary symptoms are bulk related due to the tall and elongated uterine fibroids arising from the anterior aspect of her uterus.  We discussed the natural history of uterine fibroids as well as treatment options including uterine artery embolization.  I reviewed the risks, benefits and alternatives to pursuing uterine artery embolization.  At this time, Kerri Cortez is considering the procedure but is uncertain.  She will reach out to [place] if she desires to proceed and have the procedure scheduled.  Thank you for this interesting consult.  I greatly enjoyed meeting Kerri Cortez and look forward to  participating in their care.  A copy of this report was sent to the requesting provider on this date.  Electronically Signed: Sterling Big 08/21/2023, 4:27 PM   I spent a total of 40 Minutes  in face to face in clinical consultation, greater than 50% of which was counseling/coordinating care for uterine fibroids

## 2023-08-22 ENCOUNTER — Other Ambulatory Visit: Payer: Self-pay | Admitting: Obstetrics and Gynecology

## 2023-08-22 DIAGNOSIS — Z1231 Encounter for screening mammogram for malignant neoplasm of breast: Secondary | ICD-10-CM

## 2023-08-23 DIAGNOSIS — M5451 Vertebrogenic low back pain: Secondary | ICD-10-CM | POA: Diagnosis not present

## 2023-08-29 ENCOUNTER — Ambulatory Visit: Payer: 59

## 2023-08-30 DIAGNOSIS — M5451 Vertebrogenic low back pain: Secondary | ICD-10-CM | POA: Diagnosis not present

## 2023-09-04 DIAGNOSIS — M5451 Vertebrogenic low back pain: Secondary | ICD-10-CM | POA: Diagnosis not present

## 2023-09-18 DIAGNOSIS — F411 Generalized anxiety disorder: Secondary | ICD-10-CM | POA: Diagnosis not present

## 2023-09-19 ENCOUNTER — Ambulatory Visit
Admission: RE | Admit: 2023-09-19 | Discharge: 2023-09-19 | Disposition: A | Discharge status: Home / Self Care | Payer: 59 | Source: Ambulatory Visit | Attending: Obstetrics and Gynecology | Admitting: Obstetrics and Gynecology

## 2023-09-19 DIAGNOSIS — Z1231 Encounter for screening mammogram for malignant neoplasm of breast: Secondary | ICD-10-CM

## 2023-09-25 DIAGNOSIS — F411 Generalized anxiety disorder: Secondary | ICD-10-CM | POA: Diagnosis not present

## 2023-10-06 DIAGNOSIS — F411 Generalized anxiety disorder: Secondary | ICD-10-CM | POA: Diagnosis not present

## 2023-10-17 DIAGNOSIS — F411 Generalized anxiety disorder: Secondary | ICD-10-CM | POA: Diagnosis not present

## 2023-10-24 DIAGNOSIS — F411 Generalized anxiety disorder: Secondary | ICD-10-CM | POA: Diagnosis not present

## 2023-10-30 ENCOUNTER — Ambulatory Visit (INDEPENDENT_AMBULATORY_CARE_PROVIDER_SITE_OTHER): Payer: 59 | Admitting: Sports Medicine

## 2023-10-30 DIAGNOSIS — M25552 Pain in left hip: Secondary | ICD-10-CM | POA: Diagnosis not present

## 2023-10-30 DIAGNOSIS — G8929 Other chronic pain: Secondary | ICD-10-CM

## 2023-10-30 DIAGNOSIS — M545 Low back pain, unspecified: Secondary | ICD-10-CM | POA: Diagnosis not present

## 2023-10-30 DIAGNOSIS — S3210XS Unspecified fracture of sacrum, sequela: Secondary | ICD-10-CM

## 2023-10-30 DIAGNOSIS — M25551 Pain in right hip: Secondary | ICD-10-CM

## 2023-10-30 DIAGNOSIS — S322XXS Fracture of coccyx, sequela: Secondary | ICD-10-CM | POA: Diagnosis not present
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
## 2023-11-13 ENCOUNTER — Encounter: Payer: Self-pay | Admitting: Sports Medicine

## 2023-11-13 ENCOUNTER — Other Ambulatory Visit: Payer: Self-pay

## 2023-11-13 ENCOUNTER — Ambulatory Visit: Payer: 59 | Admitting: Sports Medicine

## 2023-11-13 DIAGNOSIS — M545 Low back pain, unspecified: Secondary | ICD-10-CM

## 2023-11-13 DIAGNOSIS — M25552 Pain in left hip: Secondary | ICD-10-CM

## 2023-11-13 DIAGNOSIS — G8929 Other chronic pain: Secondary | ICD-10-CM

## 2023-11-13 DIAGNOSIS — R29898 Other symptoms and signs involving the musculoskeletal system: Secondary | ICD-10-CM | POA: Diagnosis not present

## 2023-11-13 DIAGNOSIS — M25551 Pain in right hip: Secondary | ICD-10-CM | POA: Diagnosis not present

## 2023-11-13 MED ORDER — LIDOCAINE HCL 1 % IJ SOLN
2.0000 mL | INTRAMUSCULAR | Status: AC | PRN
  Administered 2023-11-13: 2 mL

## 2023-11-13 MED ORDER — BETAMETHASONE SOD PHOS & ACET 6 (3-3) MG/ML IJ SUSP
6.0000 mg | INTRAMUSCULAR | Status: AC | PRN
  Administered 2023-11-13: 6 mg via INTRA_ARTICULAR

## 2023-11-13 MED ORDER — BUPIVACAINE HCL 0.25 % IJ SOLN
2.0000 mL | INTRAMUSCULAR | Status: AC | PRN
  Administered 2023-11-13: 2 mL via INTRA_ARTICULAR
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
## 2023-12-15 DIAGNOSIS — F4323 Adjustment disorder with mixed anxiety and depressed mood: Secondary | ICD-10-CM | POA: Diagnosis not present

## 2023-12-24 DIAGNOSIS — F4323 Adjustment disorder with mixed anxiety and depressed mood: Secondary | ICD-10-CM | POA: Diagnosis not present

## 2024-01-01 ENCOUNTER — Ambulatory Visit: Payer: 59 | Admitting: Sports Medicine

## 2024-01-28 ENCOUNTER — Encounter: Payer: Self-pay | Admitting: Sports Medicine

## 2024-01-28 ENCOUNTER — Ambulatory Visit (INDEPENDENT_AMBULATORY_CARE_PROVIDER_SITE_OTHER): Payer: 59 | Admitting: Sports Medicine

## 2024-01-28 DIAGNOSIS — G8929 Other chronic pain: Secondary | ICD-10-CM | POA: Diagnosis not present

## 2024-01-28 DIAGNOSIS — M25551 Pain in right hip: Secondary | ICD-10-CM | POA: Diagnosis not present

## 2024-01-28 DIAGNOSIS — M25552 Pain in left hip: Secondary | ICD-10-CM | POA: Diagnosis not present

## 2024-01-28 DIAGNOSIS — M545 Low back pain, unspecified: Secondary | ICD-10-CM | POA: Diagnosis not present

## 2024-01-28 NOTE — Progress Notes (Signed)
   Kerri Cortez - 53 y.o. female MRN [id number]  Date of birth: 29-Dec-1970  Office Visit Note: Visit Date: 01/28/2024 PCP: Milus Height, PA Referred by: Milus Height, PA  Subjective: Chief Complaint  Patient presents with   Right Hip - Follow-up   Left Hip - Follow-up   Lower Back - Follow-up   HPI: Kerri Cortez is a pleasant 53 y.o. female who presents today for follow-up of lateral hip pain and low back pain.  Back in November we did perform ultrasound-guided greater trochanteric bursa injections bilaterally.  After few days she started noticing good pain improvement.  She has been doing her home exercises we provided for her, was better at these initially but is still doing them when she remembers.  She is not requiring any medication for pain.  Pertinent ROS were reviewed with the patient and found to be negative unless otherwise specified above in HPI.   Assessment & Plan: Visit Diagnoses:  1. Greater trochanteric pain syndrome of both lower extremities   2. Chronic midline low back pain without sciatica    Plan: Impression is resolved greater trochanteric pain of bilateral hips with markedly improved strength after previous injection and home therapy regimen.  Her low back pain has also resolved as well.  We did discuss the importance of transitioning to home exercise therapy for a maintenance regimen, recommended 3 times weekly for at least the next 6 weeks and then may return to activity without needing to be as adherent to her exercises.  She will follow-up with me as needed.  Follow-up: Return if symptoms worsen or fail to improve.   Meds & Orders: No orders of the defined types were placed in this encounter.  No orders of the defined types were placed in this encounter.    Procedures: No procedures performed      Clinical History: No specialty comments available.  She reports that she has never smoked. She has never used smokeless tobacco. No results for  input(s): "HGBA1C", "LABURIC" in the last 8760 hours.  Objective:    Physical Exam  Gen: Well-appearing, in no acute distress; non-toxic CV: Well-perfused. Warm.  Resp: Breathing unlabored on room air; no wheezing. Psych: Fluid speech in conversation; appropriate affect; normal thought process  Ortho Exam - Bilateral hips: There is no greater trochanter TTP.  The hips move fluidly without any restriction with internal and external logroll.  There is markedly improved strength with lateral hip abduction that is equivocal on both sides and near full strength.  - Lumbar: Negative SLR  Imaging: No results found.  Past Medical/Family/Surgical/Social History: Medications & Allergies reviewed per EMR, new medications updated. Patient Active Problem List   Diagnosis Date Noted   Essential hypertension 08/23/2020   Microcytic anemia 08/23/2020   Chest pressure 08/22/2020   Past Medical History:  Diagnosis Date   Hypertension    Family History  Problem Relation Age of Onset   Hypertension Mother    Heart disease Father    Past Surgical History:  Procedure Laterality Date   IR RADIOLOGIST EVAL & MGMT  08/21/2023   Social History   Occupational History   Not on file  Tobacco Use   Smoking status: Never   Smokeless tobacco: Never  Vaping Use   Vaping status: Never Used  Substance and Sexual Activity   Alcohol use: Not Currently   Drug use: Never   Sexual activity: Not on file

## 2024-01-28 NOTE — Progress Notes (Signed)
Patient says that the injections really helped. She has no hip or low back pain today. She says she does her exercises some at home.

## 2024-04-02 DIAGNOSIS — F4323 Adjustment disorder with mixed anxiety and depressed mood: Secondary | ICD-10-CM | POA: Diagnosis not present

## 2024-04-16 DIAGNOSIS — F4323 Adjustment disorder with mixed anxiety and depressed mood: Secondary | ICD-10-CM | POA: Diagnosis not present

## 2024-04-28 ENCOUNTER — Other Ambulatory Visit: Payer: Self-pay | Admitting: Obstetrics and Gynecology

## 2024-04-28 ENCOUNTER — Other Ambulatory Visit (HOSPITAL_COMMUNITY)
Admission: RE | Admit: 2024-04-28 | Discharge: 2024-04-28 | Disposition: A | Discharge status: Home / Self Care | Source: Ambulatory Visit | Attending: Obstetrics and Gynecology | Admitting: Obstetrics and Gynecology

## 2024-04-28 DIAGNOSIS — Z01419 Encounter for gynecological examination (general) (routine) without abnormal findings: Secondary | ICD-10-CM | POA: Insufficient documentation

## 2024-04-28 DIAGNOSIS — D259 Leiomyoma of uterus, unspecified: Secondary | ICD-10-CM | POA: Diagnosis not present

## 2024-04-29 LAB — CYTOLOGY - PAP
Comment: NEGATIVE
Diagnosis: NEGATIVE
High risk HPV: NEGATIVE

## 2024-05-14 DIAGNOSIS — D219 Benign neoplasm of connective and other soft tissue, unspecified: Secondary | ICD-10-CM | POA: Diagnosis not present

## 2024-05-21 DIAGNOSIS — F4323 Adjustment disorder with mixed anxiety and depressed mood: Secondary | ICD-10-CM | POA: Diagnosis not present

## 2024-05-21 DIAGNOSIS — E876 Hypokalemia: Secondary | ICD-10-CM | POA: Diagnosis not present

## 2024-06-04 DIAGNOSIS — F4323 Adjustment disorder with mixed anxiety and depressed mood: Secondary | ICD-10-CM | POA: Diagnosis not present

## 2024-06-16 DIAGNOSIS — K635 Polyp of colon: Secondary | ICD-10-CM | POA: Diagnosis not present

## 2024-06-16 DIAGNOSIS — Z09 Encounter for follow-up examination after completed treatment for conditions other than malignant neoplasm: Secondary | ICD-10-CM | POA: Diagnosis not present

## 2024-06-16 DIAGNOSIS — Z860101 Personal history of adenomatous and serrated colon polyps: Secondary | ICD-10-CM | POA: Diagnosis not present

## 2024-06-18 DIAGNOSIS — F4323 Adjustment disorder with mixed anxiety and depressed mood: Secondary | ICD-10-CM | POA: Diagnosis not present

## 2024-07-16 DIAGNOSIS — M545 Low back pain, unspecified: Secondary | ICD-10-CM | POA: Diagnosis not present

## 2024-07-16 DIAGNOSIS — I10 Essential (primary) hypertension: Secondary | ICD-10-CM | POA: Diagnosis not present

## 2024-07-16 DIAGNOSIS — Z809 Family history of malignant neoplasm, unspecified: Secondary | ICD-10-CM | POA: Diagnosis not present

## 2024-07-16 DIAGNOSIS — E669 Obesity, unspecified: Secondary | ICD-10-CM | POA: Diagnosis not present

## 2024-07-16 DIAGNOSIS — Z6831 Body mass index (BMI) 31.0-31.9, adult: Secondary | ICD-10-CM | POA: Diagnosis not present

## 2024-07-16 DIAGNOSIS — D509 Iron deficiency anemia, unspecified: Secondary | ICD-10-CM | POA: Diagnosis not present

## 2024-07-16 DIAGNOSIS — Z8249 Family history of ischemic heart disease and other diseases of the circulatory system: Secondary | ICD-10-CM | POA: Diagnosis not present

## 2024-07-30 DIAGNOSIS — F4323 Adjustment disorder with mixed anxiety and depressed mood: Secondary | ICD-10-CM | POA: Diagnosis not present

## 2024-08-13 DIAGNOSIS — F4323 Adjustment disorder with mixed anxiety and depressed mood: Secondary | ICD-10-CM | POA: Diagnosis not present

## 2024-09-10 DIAGNOSIS — F4323 Adjustment disorder with mixed anxiety and depressed mood: Secondary | ICD-10-CM | POA: Diagnosis not present

## 2024-10-23 DIAGNOSIS — F4323 Adjustment disorder with mixed anxiety and depressed mood: Secondary | ICD-10-CM | POA: Diagnosis not present

## 2024-10-27 ENCOUNTER — Encounter: Payer: Self-pay | Admitting: Radiology

## 2024-11-13 DIAGNOSIS — F4323 Adjustment disorder with mixed anxiety and depressed mood: Secondary | ICD-10-CM | POA: Diagnosis not present
